# Patient Record
Sex: Male | Born: 1980 | Race: Black or African American | Hispanic: No | Smoking: Current every day smoker
Health system: Southern US, Community
[De-identification: ages and names within clinical notes are randomized; demographics above are authoritative.]

## PROBLEM LIST (undated history)

## (undated) HISTORY — PX: TYMPANOSTOMY TUBE PLACEMENT: SHX32

## (undated) HISTORY — PX: TONSILLECTOMY: SUR1361

---

## 2006-02-11 ENCOUNTER — Emergency Department: Payer: Self-pay | Admitting: Emergency Medicine

## 2007-12-26 ENCOUNTER — Emergency Department: Payer: Self-pay | Admitting: Emergency Medicine

## 2007-12-27 ENCOUNTER — Emergency Department: Payer: Self-pay | Admitting: Emergency Medicine

## 2007-12-29 ENCOUNTER — Emergency Department: Payer: Self-pay

## 2009-09-18 IMAGING — CT CT NECK WITH CONTRAST
1 of 2 series · 9 of 14 positions shown, 12 images · IV contrast (agent unspecified)
Comparison: none

REASON FOR EXAM: difficulty swallowing, facial swelling, dental abscess
COMMENTS:

PROCEDURE:     CT  - CT NECK WITH CONTRAST  - December 27, 2007  [DATE]
RESULT:     Comparison: No comparison
TECHNIQUE: Multiple sequential axial images from the apices of the lungs to
the level of the orbits obtained with 75 ml of 1sovue-HQ2 IV contrast.

[Series 2: soft tissue · axial · 0.51mm/px · z∈[+124,+388]mm · 9 of 112 slices shown, 12 images]
[im 12/112  soft-tissue]
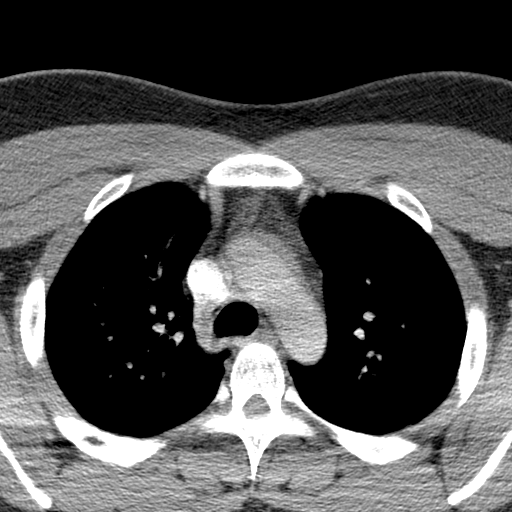
[im 12/112  bone]
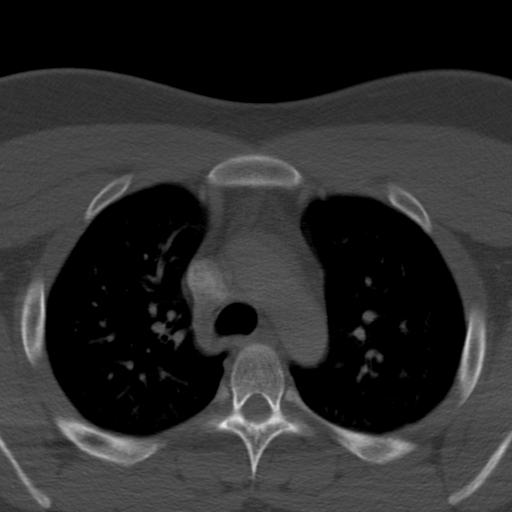
[im 23/112  bone]
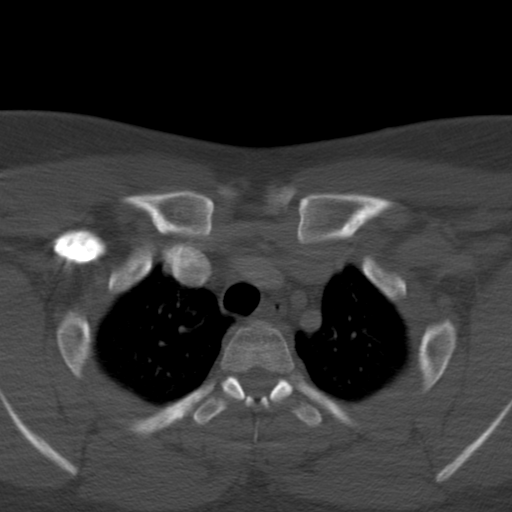
[im 34/112  bone]
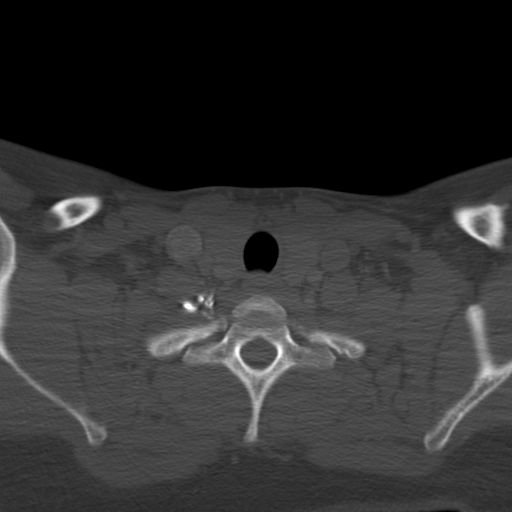
[im 45/112  bone]
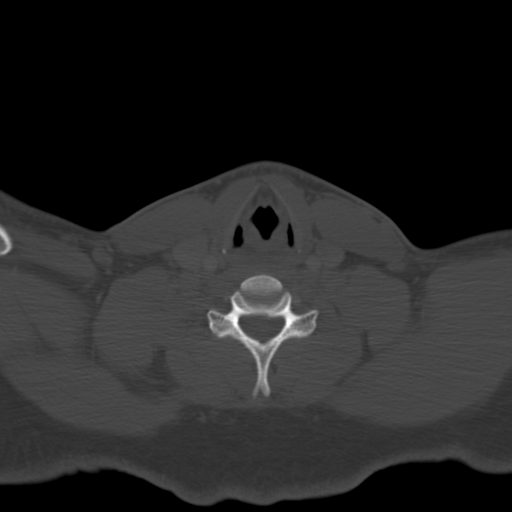
[im 56/112  soft-tissue]
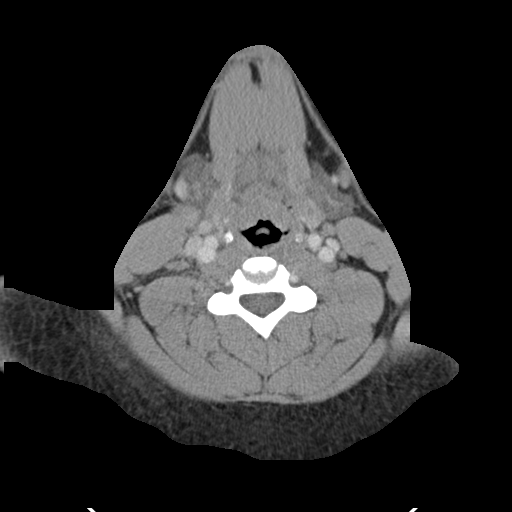
[im 56/112  bone]
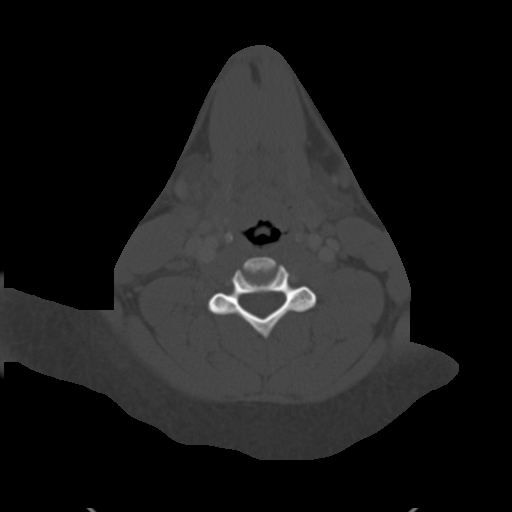
[im 67/112  bone]
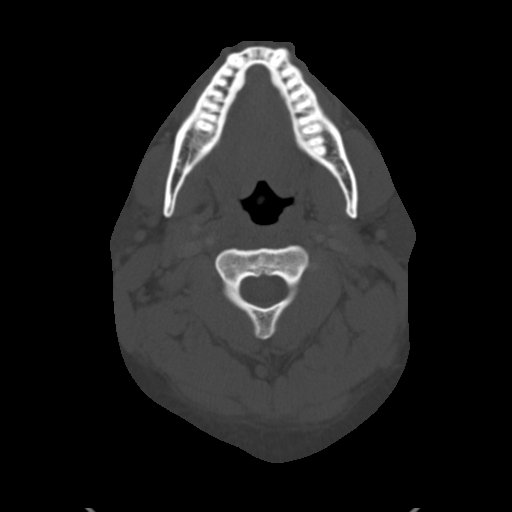
[im 78/112  bone]
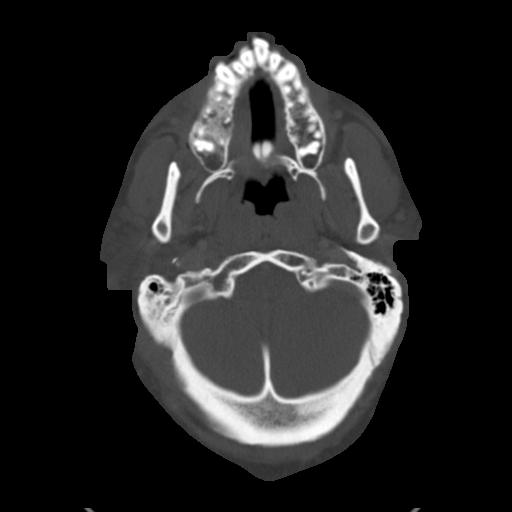
[im 89/112  bone]
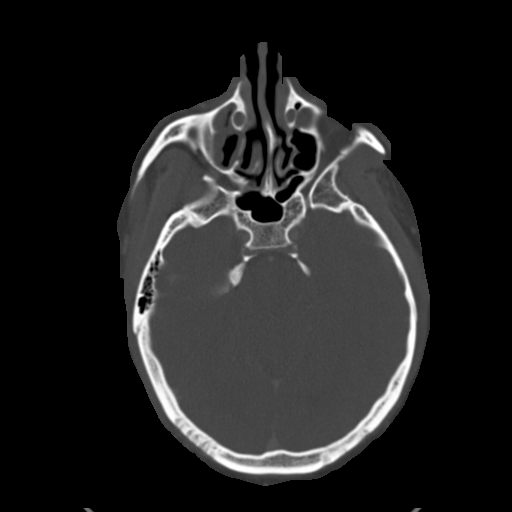
[im 100/112  soft-tissue]
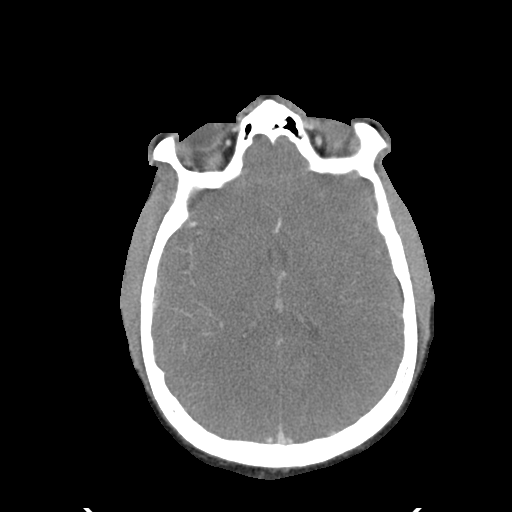
[im 100/112  bone]
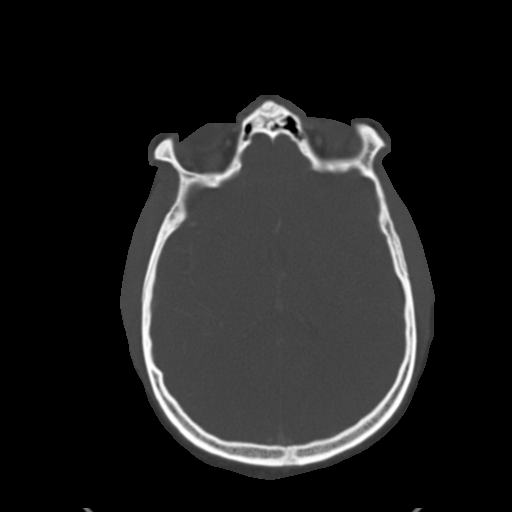

[9 of 14 positions shown; findings below may reference images not displayed]

FINDINGS: There is no lymphadenopathy. No masses are identified. There is no focal
fluid collection to suggest an abscess. There is no compression or
impingement on the airway. Grossly the vascular structures are normal.

The imaged intracranial structures are normal.

There is mucosal thickening involving the right maxillary sinus. The osseous
structures are unremarkable.
IMPRESSION: 1. Unremarkable CT of the neck.
2. Right maxillary sinus disease.

## 2014-03-28 ENCOUNTER — Emergency Department: Payer: Self-pay | Admitting: Emergency Medicine

## 2014-10-09 ENCOUNTER — Encounter: Payer: Self-pay | Admitting: Emergency Medicine

## 2014-10-09 ENCOUNTER — Emergency Department
Admission: EM | Admit: 2014-10-09 | Discharge: 2014-10-09 | Disposition: A | Discharge status: Home / Self Care | Payer: Self-pay | Attending: Student | Admitting: Student

## 2014-10-09 DIAGNOSIS — Z720 Tobacco use: Secondary | ICD-10-CM | POA: Insufficient documentation

## 2014-10-09 DIAGNOSIS — Z202 Contact with and (suspected) exposure to infections with a predominantly sexual mode of transmission: Secondary | ICD-10-CM

## 2014-10-09 DIAGNOSIS — A599 Trichomoniasis, unspecified: Secondary | ICD-10-CM | POA: Insufficient documentation

## 2014-10-09 MED ORDER — METRONIDAZOLE 500 MG PO TABS: 1000.0000 mg | ORAL_TABLET | Freq: Two times a day (BID) | ORAL | Status: AC

## 2014-10-09 NOTE — ED Notes (Signed)
Was informed that sexual partner was positive for STD,pt has no specific symptoms

## 2014-10-09 NOTE — ED Provider Notes (Signed)
[facility] Emergency Department Provider Note  ____________________________________________  Time seen: Approximately 2:49 PM  I have reviewed the triage vital signs and the nursing notes.   HISTORY  Chief Complaint Exposure to STD    HPI Steven Wright is a 34 y.o. male resents for evaluation of Trichomonas exposure. New sexual contact denies any penile discharge. His partner was just treated yesterday.   History reviewed. No pertinent past medical history.  There are no active problems to display for this patient.   Past Surgical History  Procedure Laterality Date  . Tonsillectomy      Current Outpatient Rx  Name  Route  Sig  Dispense  Refill  . metroNIDAZOLE (FLAGYL) 500 MG tablet   Oral   Take 2 tablets (1,000 mg total) by mouth 2 (two) times daily.   4 tablet   0     Allergies Review of patient's allergies indicates no known allergies.  History reviewed. No pertinent family history.  Social History History  Substance Use Topics  . Smoking status: Current Every Day Smoker  . Smokeless tobacco: Not on file  . Alcohol Use: Not on file    Review of Systems Constitutional: No fever/chills Eyes: No visual changes. ENT: No sore throat. Cardiovascular: Denies chest pain. Respiratory: Denies shortness of breath. Gastrointestinal: No abdominal pain.  No nausea, no vomiting.  No diarrhea.  No constipation. Genitourinary: Negative for dysuria. Positive for trichomoniasis exposure Musculoskeletal: Negative for back pain. Skin: Negative for rash. Neurological: Negative for headaches, focal weakness or numbness.  10-point ROS otherwise negative.  ____________________________________________   PHYSICAL EXAM:  VITAL SIGNS: ED Triage Vitals  Enc Vitals Group     BP 10/09/14 1405 0/0 mmHg     Pulse Rate 10/09/14 1405 62     Resp 10/09/14 1405 18     Temp 10/09/14 1405 98.6 F (37 C)     Temp Source 10/09/14 1405 Oral     SpO2  10/09/14 1405 100 %     Weight 10/09/14 1405 300 lb (136.079 kg)     Height 10/09/14 1405 6' (1.829 m)     Head Cir --      Peak Flow --      Pain Score 10/09/14 1411 0     Pain Loc --      Pain Edu? --      Excl. in GC? --     Constitutional: Alert and oriented. Well appearing and in no acute distress..   Cardiovascular: Normal rate, regular rhythm. Grossly normal heart sounds.  Good peripheral circulation. Respiratory: Normal respiratory effort.  No retractions. Lungs CTAB. Gastrointestinal: Soft and nontender. No distention. No abdominal bruits. No CVA tenderness. Musculoskeletal: No lower extremity tenderness nor edema.  No joint effusions. Neurologic:  Normal speech and language. No gross focal neurologic deficits are appreciated. No gait instability. Skin:  Skin is warm, dry and intact. No rash noted. Psychiatric: Mood and affect are normal. Speech and behavior are normal.  ____________________________________________   LABS (all labs ordered are listed, but only abnormal results are displayed)  Labs Reviewed - No data to display   PROCEDURES  Procedure(s) performed: None  Critical Care performed: No  ____________________________________________   INITIAL IMPRESSION / ASSESSMENT AND PLAN / ED COURSE  Pertinent labs & imaging results that were available during my care of the patient were reviewed by me and considered in my medical decision making (see chart for details).  Trichomonas exposure. Rx given for Flagyl 1000 mg  twice a day for 1 day. Encouraged patient to go to health Department for further STD testing. She voices no other emergency medical complaints at this time. ____________________________________________   FINAL CLINICAL IMPRESSION(S) / ED DIAGNOSES  Final diagnoses:  Trichomonas contact, treated      Evangeline Dakin, PA-C 10/09/14 1511  Gayla Doss, MD 10/09/14 1721

## 2014-10-09 NOTE — Discharge Instructions (Signed)
Trichomoniasis Trichomoniasis is an infection caused by an organism called Trichomonas. The infection can affect both women and men. In women, the outer male genitalia and the vagina are affected. In men, the penis is mainly affected, but the prostate and other reproductive organs can also be involved. Trichomoniasis is a sexually transmitted infection (STI) and is most often passed to another person through sexual contact.  RISK FACTORS  Having unprotected sexual intercourse.  Having sexual intercourse with an infected partner. SIGNS AND SYMPTOMS  Symptoms of trichomoniasis in women include:  Abnormal gray-green frothy vaginal discharge.  Itching and irritation of the vagina.  Itching and irritation of the area outside the vagina. Symptoms of trichomoniasis in men include:   Penile discharge with or without pain.  Pain during urination. This results from inflammation of the urethra. DIAGNOSIS  Trichomoniasis may be found during a Pap test or physical exam. Your health care provider may use one of the following methods to help diagnose this infection:  Examining vaginal discharge under a microscope. For men, urethral discharge would be examined.  Testing the pH of the vagina with a test tape.  Using a vaginal swab test that checks for the Trichomonas organism. A test is available that provides results within a few minutes.  Doing a culture test for the organism. This is not usually needed. TREATMENT   You may be given medicine to fight the infection. Women should inform their health care provider if they could be or are pregnant. Some medicines used to treat the infection should not be taken during pregnancy.  Your health care provider may recommend over-the-counter medicines or creams to decrease itching or irritation.  Your sexual partner will need to be treated if infected. HOME CARE INSTRUCTIONS   Take medicines only as directed by your health care provider.  Take  over-the-counter medicine for itching or irritation as directed by your health care provider.  Do not have sexual intercourse while you have the infection.  Women should not douche or wear tampons while they have the infection.  Discuss your infection with your partner. Your partner may have gotten the infection from you, or you may have gotten it from your partner.  Have your sex partner get examined and treated if necessary.  Practice safe, informed, and protected sex.  See your health care provider for other STI testing. SEEK MEDICAL CARE IF:   You still have symptoms after you finish your medicine.  You develop abdominal pain.  You have pain when you urinate.  You have bleeding after sexual intercourse.  You develop a rash.  Your medicine makes you sick or makes you throw up (vomit). MAKE SURE YOU:  Understand these instructions.  Will watch your condition.  Will get help right away if you are not doing well or get worse. Document Released: 08/25/2000 Document Revised: 07/16/2013 Document Reviewed: 12/11/2012 [employer] Patient Information 2015 [employer]. This information is not intended to replace advice given to you by your health care provider. Make sure you discuss any questions you have with your health care provider.  

## 2014-10-16 ENCOUNTER — Encounter: Payer: Self-pay | Admitting: Urgent Care

## 2014-10-16 DIAGNOSIS — Z720 Tobacco use: Secondary | ICD-10-CM | POA: Insufficient documentation

## 2014-10-16 DIAGNOSIS — Z792 Long term (current) use of antibiotics: Secondary | ICD-10-CM | POA: Insufficient documentation

## 2014-10-16 DIAGNOSIS — H6091 Unspecified otitis externa, right ear: Secondary | ICD-10-CM | POA: Insufficient documentation

## 2014-10-16 NOTE — ED Notes (Signed)
Patient presents with c/o RIGHT ear pain x 1 week. (+) bloody discharge appreciated yesterday.

## 2014-10-17 ENCOUNTER — Emergency Department
Admission: EM | Admit: 2014-10-17 | Discharge: 2014-10-17 | Disposition: A | Discharge status: Home / Self Care | Payer: Self-pay | Attending: Emergency Medicine | Admitting: Emergency Medicine

## 2014-10-17 DIAGNOSIS — H6091 Unspecified otitis externa, right ear: Secondary | ICD-10-CM

## 2014-10-17 MED ORDER — HYDROCODONE-ACETAMINOPHEN 5-325 MG PO TABS
1.0000 | ORAL_TABLET | Freq: Once | ORAL | Status: AC
  Administered 2014-10-17: 1 via ORAL
  Filled 2014-10-17: qty 1

## 2014-10-17 MED ORDER — HYDROCODONE-ACETAMINOPHEN 5-325 MG PO TABS: 1.0000 | ORAL_TABLET | ORAL | Status: AC | PRN

## 2014-10-17 MED ORDER — CIPROFLOXACIN-HYDROCORTISONE 0.2-1 % OT SUSP: 4.0000 [drp] | Freq: Two times a day (BID) | OTIC | Status: AC

## 2014-10-17 NOTE — ED Provider Notes (Signed)
[facility] Emergency Department Provider Note  Time seen: 1:30 AM  I have reviewed the triage vital signs and the nursing notes.   HISTORY  Chief Complaint Otalgia    HPI Steven Wright is a 34 y.o. male with no past medical history who presents to the emergency department with right ear pain and discharge. According to the patient his right ear has been bothering him intermittently for the past several weeks. The last few days he states it has been a constant pain in the right ear now with drainage since last night. States the pain is moderate, dull/aching. Denies fever, nausea, vomiting.     History reviewed. No pertinent past medical history.  There are no active problems to display for this patient.   Past Surgical History  Procedure Laterality Date  . Tonsillectomy    . Tympanostomy tube placement      x 3 on RIGHT and x 1 on LEFT    Current Outpatient Rx  Name  Route  Sig  Dispense  Refill  . metroNIDAZOLE (FLAGYL) 500 MG tablet   Oral   Take 2 tablets (1,000 mg total) by mouth 2 (two) times daily.   4 tablet   0     Allergies Review of patient's allergies indicates no known allergies.  No family history on file.  Social History History  Substance Use Topics  . Smoking status: Current Every Day Smoker  . Smokeless tobacco: Not on file  . Alcohol Use: Yes    Review of Systems Constitutional: Negative for fever. Eyes: Negative for visual changes. ENT: Positive for right ear pain and discharge. Cardiovascular: Negative for chest pain. Respiratory: Negative for shortness of breath. Gastrointestinal: Negative for abdominal pain Neurological: Negative for headache 10-point ROS otherwise negative.  ____________________________________________   PHYSICAL EXAM:  VITAL SIGNS: ED Triage Vitals  Enc Vitals Group     BP 10/16/14 2336 125/77 mmHg     Pulse Rate 10/16/14 2336 120     Resp 10/16/14 2336 18     Temp 10/16/14  2336 99.6 F (37.6 C)     Temp Source 10/16/14 2336 Oral     SpO2 10/16/14 2336 96 %     Weight 10/16/14 2336 299 lb (135.626 kg)     Height 10/16/14 2336 6' (1.829 m)     Head Cir --      Peak Flow --      Pain Score 10/16/14 2351 7     Pain Loc --      Pain Edu? --      Excl. in GC? --     Constitutional: Alert and oriented. Well appearing and in no distress. Eyes: Normal exam ENT: Right ear consistent with a swollen auditory canal, with mild discharge. Exam most consistent with otitis externa. It is difficult to visualize his tympanic membrane due to the auditory canal swelling. Cardiovascular: Normal rate, regular rhythm. No murmur Respiratory: Normal respiratory effort without tachypnea nor retractions. Breath sounds are clea Gastrointestinal: Soft and nontender.  Musculoskeletal: Ambulates without difficulty moves all extremities. Neurologic:  Normal speech and language. No gross focal neurologic deficits  Skin:  Skin is warm, dry and intact.  Psychiatric: Mood and affect are normal. Speech and behavior are normal.  ____________________________________________    INITIAL IMPRESSION / ASSESSMENT AND PLAN / ED COURSE  Pertinent labs & imaging results that were available during my care of the patient were reviewed by me and considered in my medical decision making (  see chart for details).  Exam consistent with otitis externa. Unable to directly visualize the right tympanic membrane due to auditory canal swelling. We will place the patient on antibiotics drops, Norco as needed, and follow-up with his primary care doctor. Patient agreeable to plan.  ____________________________________________   FINAL CLINICAL IMPRESSION(S) / ED DIAGNOSES  Right otitis externa   Minna Antis, MD 10/17/14 [phone]

## 2014-10-17 NOTE — ED Provider Notes (Signed)
Patient reports medication too expensive. Called in Cortisporin to [facility] on [address] for patient  Jene Every, MD 10/17/14 1254

## 2014-10-17 NOTE — ED Notes (Signed)
Pt [facility] on stretcher in exam room with no distress noted; pt reports right earache x week with muffled hearing; denies any accomp symptoms

## 2014-10-17 NOTE — Discharge Instructions (Signed)

## 2019-11-08 ENCOUNTER — Ambulatory Visit: Payer: Self-pay | Attending: Internal Medicine

## 2019-11-08 DIAGNOSIS — Z23 Encounter for immunization: Secondary | ICD-10-CM

## 2019-11-08 NOTE — Progress Notes (Signed)
   Covid-19 Vaccination Clinic  Name:  Steven Wright    MRN: [id number] DOB: 04-21-1980  11/08/2019  [name] was observed post Covid-19 immunization for 15 minutes without incident. He was provided with Vaccine Information Sheet and instruction to access the V-Safe system.   [name] was instructed to call 911 with any severe reactions post vaccine: Marland Kitchen Difficulty breathing  . Swelling of face and throat  . A fast heartbeat  . A bad rash all over body  . Dizziness and weakness   Immunizations Administered    Name Date Dose VIS Date Route   Pfizer COVID-19 Vaccine 11/08/2019 10:25 AM 0.3 mL 05/09/2018 Intramuscular   Manufacturer: [employer], [employer]   Lot: [id number]   NDC: [address]-6

## 2019-11-29 ENCOUNTER — Ambulatory Visit: Payer: Self-pay
